# Patient Record
Sex: Female | Born: 1990 | Race: Black or African American | Hispanic: No | Marital: Single | Smoking: Never smoker
Health system: Southern US, Community
[De-identification: ages and names within clinical notes are randomized; demographics above are authoritative.]

---
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
## 2012-01-19 ENCOUNTER — Encounter: Payer: Self-pay | Admitting: Family Medicine

## 2012-01-24 ENCOUNTER — Encounter: Payer: Self-pay | Admitting: Family Medicine

## 2012-01-30 DIAGNOSIS — S73199A Other sprain of unspecified hip, initial encounter: Secondary | ICD-10-CM | POA: Insufficient documentation

## 2012-07-30 ENCOUNTER — Ambulatory Visit: Payer: Self-pay | Admitting: Family Medicine

## 2012-08-27 DIAGNOSIS — M76899 Other specified enthesopathies of unspecified lower limb, excluding foot: Secondary | ICD-10-CM | POA: Insufficient documentation

## 2012-09-13 ENCOUNTER — Ambulatory Visit: Payer: Self-pay | Admitting: Family Medicine

## 2012-09-14 ENCOUNTER — Ambulatory Visit: Payer: Self-pay | Admitting: Family Medicine

## 2012-10-17 ENCOUNTER — Ambulatory Visit: Payer: Self-pay | Admitting: Family Medicine

## 2012-10-22 ENCOUNTER — Ambulatory Visit: Payer: Self-pay | Admitting: Family Medicine

## 2012-12-24 DIAGNOSIS — M25559 Pain in unspecified hip: Secondary | ICD-10-CM | POA: Insufficient documentation

## 2013-01-06 DIAGNOSIS — M898X9 Other specified disorders of bone, unspecified site: Secondary | ICD-10-CM | POA: Insufficient documentation

## 2013-01-14 ENCOUNTER — Ambulatory Visit: Payer: Self-pay | Admitting: Family Medicine

## 2013-01-24 ENCOUNTER — Ambulatory Visit: Payer: Self-pay | Admitting: Family Medicine

## 2013-11-15 IMAGING — CR CERVICAL SPINE - COMPLETE 4+ VIEW
1 series · 7 of 7 positions shown · non-contrast
Comparison: none

REASON FOR EXAM: cervical pain following twisting motion fall playing
basketball
COMMENTS:

[Series 1: lat · 0.17mm/px · 7 of 7 slices shown]
[im 1/7]
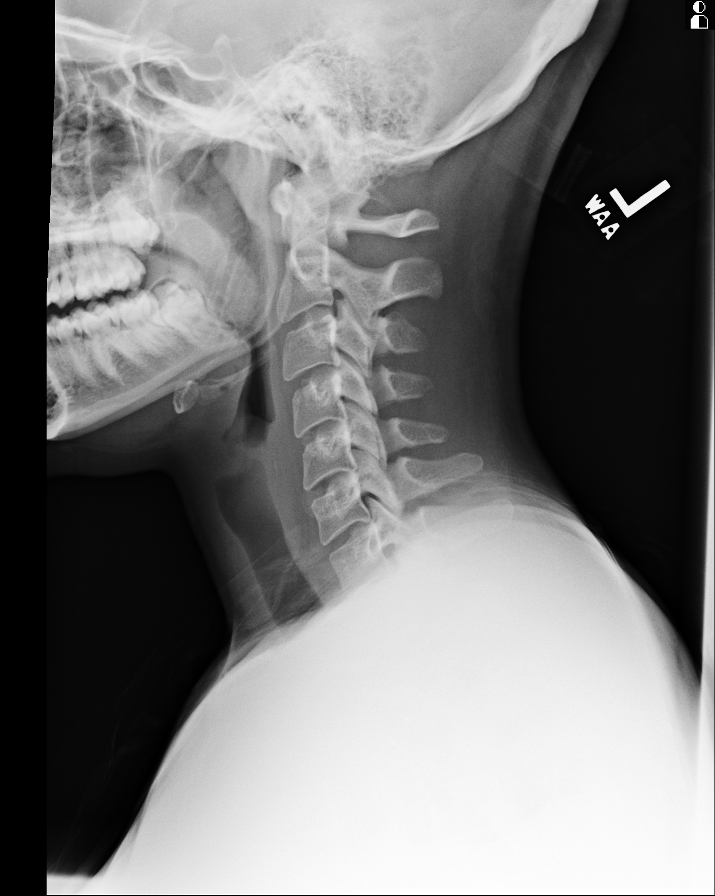
[im 2/7]
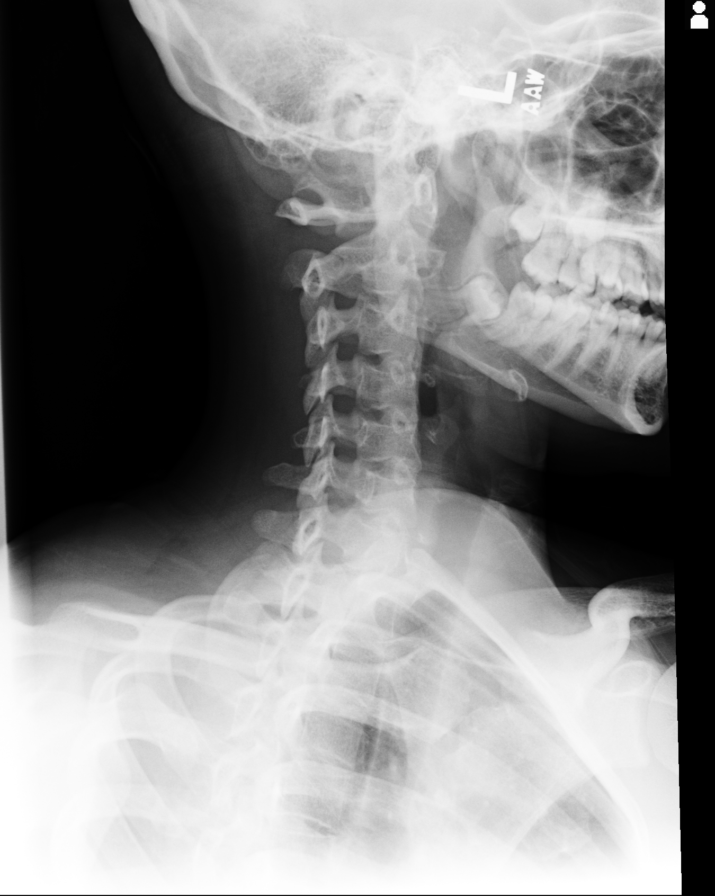
[im 3/7]
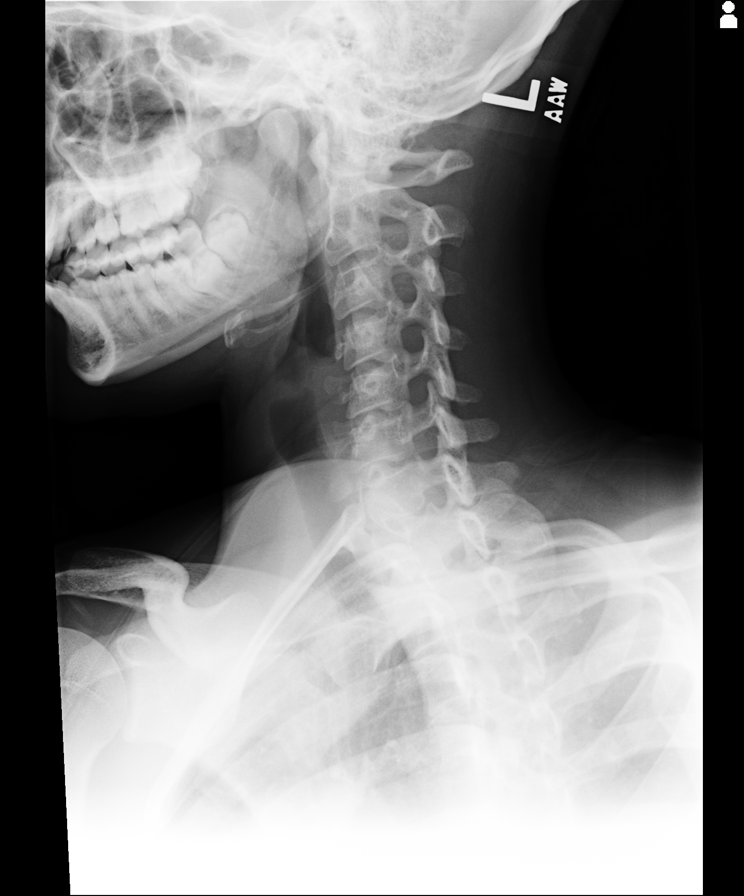
[im 4/7]
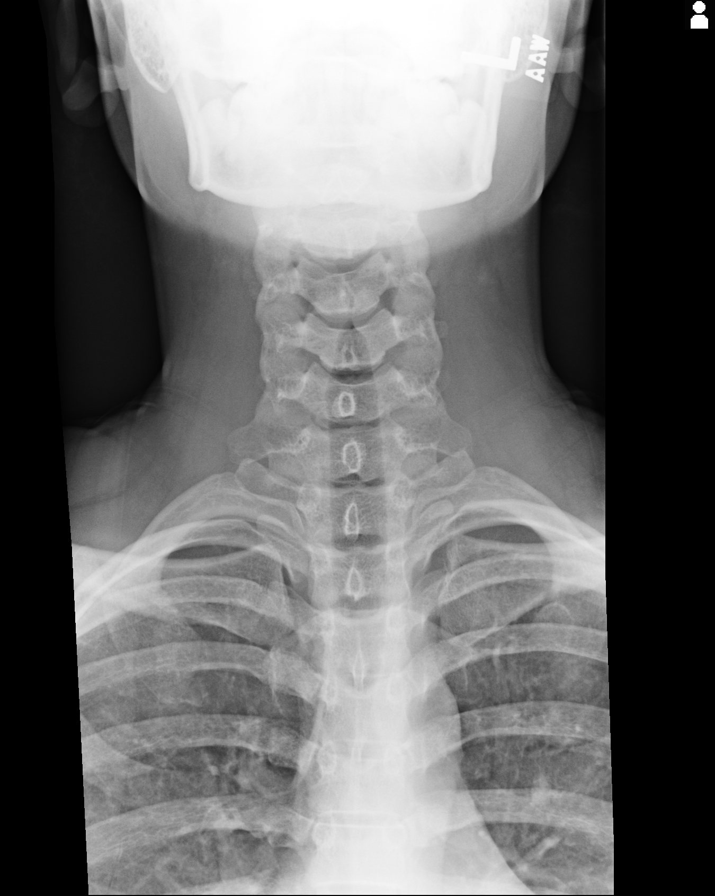
[im 5/7]
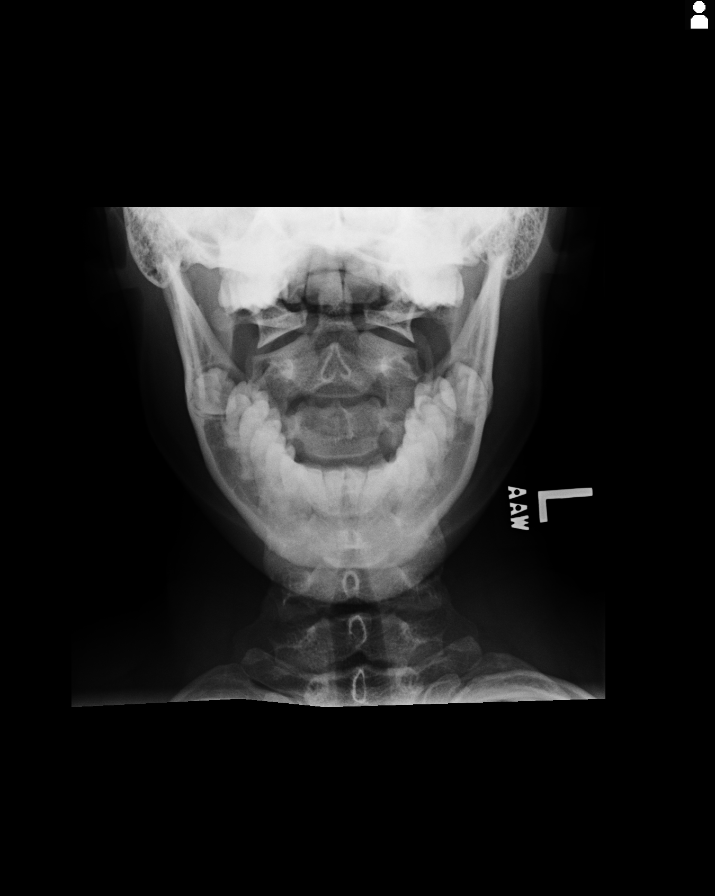
[im 6/7]
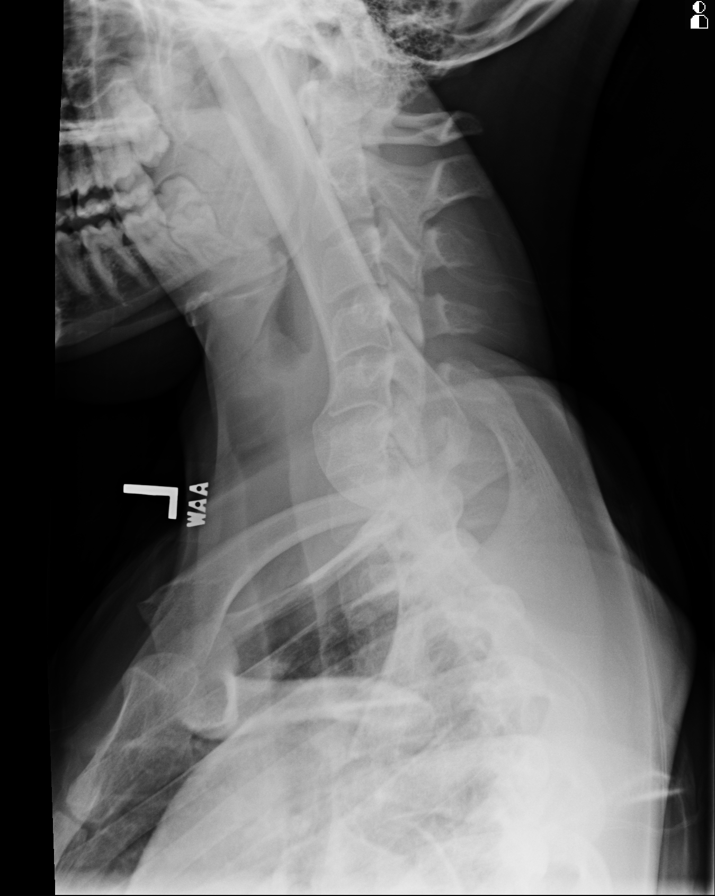
[im 7/7]
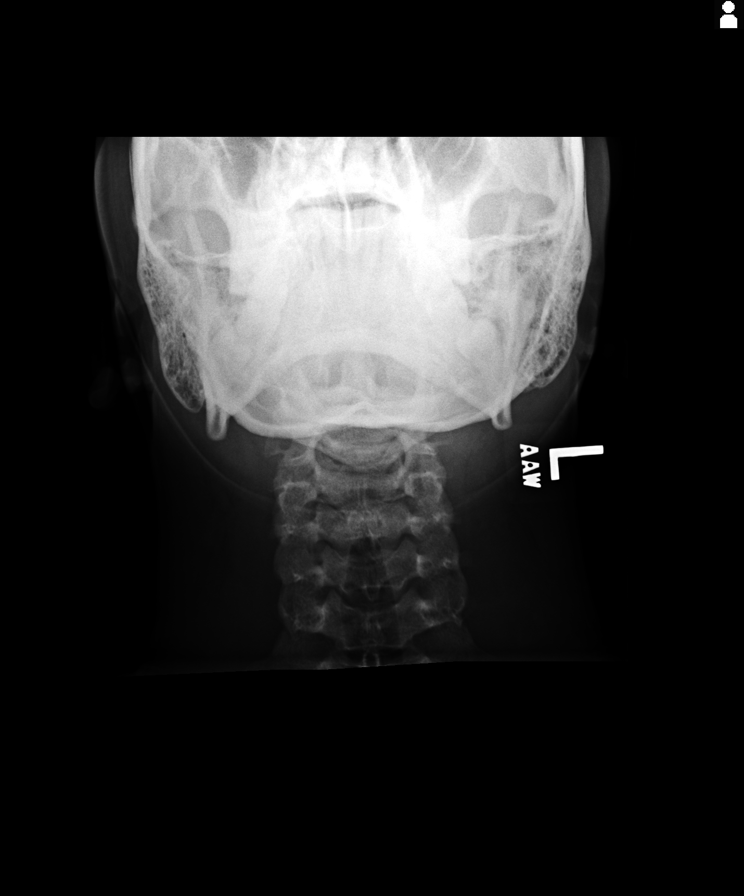

[7 of 7 positions shown; findings below may reference images not displayed]

PROCEDURE:     KDR - KDXR C-SPINE COMPLETE  - October 22, 2012  [DATE]

RESULT:     Cervical spine images demonstrate normal alignment. The
prevertebral soft tissues are maintained. The facets are normally aligned.
There is no bony encroachment on the foramina. The atlantoaxial alignment is
normal. The cervicothoracic junction is unremarkable.
IMPRESSION: No significant degenerative change. No acute bony
abnormality evident.

[REDACTED]

## 2014-09-11 ENCOUNTER — Ambulatory Visit: Payer: Self-pay | Admitting: Family Medicine

## 2014-09-11 LAB — URINALYSIS, COMPLETE
Bilirubin,UR: NEGATIVE
Blood: NEGATIVE
GLUCOSE, UR: NEGATIVE
KETONE: NEGATIVE
Nitrite: NEGATIVE
Ph: 6.5 (ref 5.0–8.0)
Protein: NEGATIVE
Specific Gravity: 1.02 (ref 1.000–1.030)

## 2014-09-11 LAB — WET PREP, GENITAL

## 2014-09-11 LAB — PREGNANCY, URINE: PREGNANCY TEST, URINE: NEGATIVE m[IU]/mL

## 2014-09-12 LAB — GC/CHLAMYDIA PROBE AMP

## 2014-09-13 LAB — URINE CULTURE

## 2014-11-03 ENCOUNTER — Ambulatory Visit: Admit: 2014-11-03 | Disposition: A | Discharge status: Home / Self Care | Payer: Self-pay | Admitting: Family Medicine

## 2015-01-26 ENCOUNTER — Ambulatory Visit (INDEPENDENT_AMBULATORY_CARE_PROVIDER_SITE_OTHER): Payer: No Typology Code available for payment source | Admitting: Obstetrics & Gynecology

## 2015-01-26 ENCOUNTER — Encounter: Payer: Self-pay | Admitting: Obstetrics & Gynecology

## 2015-01-26 VITALS — BP 111/75 | HR 95 | Ht 65.0 in | Wt 162.5 lb

## 2015-01-26 DIAGNOSIS — Z124 Encounter for screening for malignant neoplasm of cervix: Secondary | ICD-10-CM

## 2015-01-26 DIAGNOSIS — Z30011 Encounter for initial prescription of contraceptive pills: Secondary | ICD-10-CM | POA: Diagnosis not present

## 2015-01-26 DIAGNOSIS — Z113 Encounter for screening for infections with a predominantly sexual mode of transmission: Secondary | ICD-10-CM

## 2015-01-26 DIAGNOSIS — R0789 Other chest pain: Secondary | ICD-10-CM

## 2015-01-26 DIAGNOSIS — Z0000 Encounter for general adult medical examination without abnormal findings: Secondary | ICD-10-CM

## 2015-01-26 DIAGNOSIS — Z01411 Encounter for gynecological examination (general) (routine) with abnormal findings: Secondary | ICD-10-CM | POA: Diagnosis not present

## 2015-01-26 MED ORDER — NORGESTIMATE-ETH ESTRADIOL 0.25-35 MG-MCG PO TABS: 1.0000 | ORAL_TABLET | Freq: Every day | ORAL | Status: AC

## 2015-01-26 NOTE — Progress Notes (Signed)
Subjective:    Holly Sosa is a 24 y.o. S AA G0 female who presents for an annual exam. The patient has no complaints today except for some "random" mid chest pain for the last 2-3 months. She would also like OCPs (sprintec) The patient is not currently sexually active since December. GYN screening history: last pap: was normal. The patient wears seatbelts: yes. The patient participates in regular exercise: yes. Has the patient ever been transfused or tattooed?: yes. (tattoo) The patient reports that there is not domestic violence in her life.   Menstrual History: OB History    No data available      Menarche age: [age]  Patient's last menstrual period was 12/27/2014 (approximate).    The following portions of the patient's history were reviewed and updated as appropriate: allergies, current medications, past family history, past medical history, past social history, past surgical history and problem list.  Review of Systems A comprehensive review of systems was negative. She is a Proofreadergraduating senior at [employer] (going to go to grad school in [place] for psychology). Periods irregular when she is not on her OCPs.   Objective:    BP 111/75 mmHg  Pulse 95  Ht 5\' 5"  (1.651 m)  Wt 162 lb 8 oz (73.71 kg)  BMI 27.04 kg/m2  LMP 12/27/2014 (Approximate)  General Appearance:    Alert, cooperative, no distress, appears stated age  Head:    Normocephalic, without obvious abnormality, atraumatic  Eyes:    PERRL, conjunctiva/corneas clear, EOM's intact, fundi    benign, both eyes  Ears:    Normal TM's and external ear canals, both ears  Nose:   Nares normal, septum midline, mucosa normal, no drainage    or sinus tenderness  Throat:   Lips, mucosa, and tongue normal; teeth and gums normal  Neck:   Supple, symmetrical, trachea midline, no adenopathy;    thyroid:  no enlargement/tenderness/nodules; no carotid   bruit or JVD  Back:     Symmetric, no curvature, ROM normal, no CVA tenderness  Lungs:      Clear to auscultation bilaterally, respirations unlabored  Chest Wall:    No tenderness or deformity   Heart:    Regular rate and rhythm, S1 and S2 normal, no murmur, rub   or gallop  Breast Exam:    No tenderness, masses, or nipple abnormality  Abdomen:     Soft, non-tender, bowel sounds active all four quadrants,    no masses, no organomegaly  Genitalia:    Normal female without lesion, discharge or tenderness, NSSmid plane, NT, limited mobility, no adnexal masses or tenderness     Extremities:   Extremities normal, atraumatic, no cyanosis or edema  Pulses:   2+ and symmetric all extremities  Skin:   Skin color, texture, turgor normal, no rashes or lesions  Lymph nodes:   Cervical, supraclavicular, and axillary nodes normal  Neurologic:   CNII-XII intact, normal strength, sensation and reflexes    throughout  .    Assessment:    Healthy female exam.   Random chest pain   Plan:     Breast self exam technique reviewed and patient encouraged to perform self-exam monthly. Chlamydia specimen. GC specimen. Thin prep Pap smear.   Restart Sprintec Rec OTC tagamet and [facility]

## 2015-01-27 LAB — CYTOLOGY - PAP

## 2015-01-27 LAB — TSH: TSH: 0.839 u[IU]/mL (ref 0.350–4.500)
